# Patient Record
Sex: Male | Born: 1985 | Race: White | Hispanic: No | Marital: Married | Smoking: Never smoker
Health system: Southern US, Community
[De-identification: ages and names within clinical notes are randomized; demographics above are authoritative.]

---

## 2004-08-04 DIAGNOSIS — N200 Calculus of kidney: Secondary | ICD-10-CM

## 2004-08-04 HISTORY — DX: Calculus of kidney: N20.0

## 2005-07-05 ENCOUNTER — Emergency Department (HOSPITAL_COMMUNITY): Admission: EM | Admit: 2005-07-05 | Discharge: 2005-07-05 | Payer: Self-pay | Admitting: Emergency Medicine

## 2006-04-03 IMAGING — CT CT PELVIS W/O CM
2 of 4 series · 17 of 46 positions shown, 19 images · IV contrast (agent unspecified)
Comparison: none

CLINICAL DATA: Right-sided renal colic with flank pain.  Hematuria.  
 ABDOMEN CT WITHOUT CONTRAST:
TECHNIQUE: Multidetector CT imaging of the abdomen was performed following the standard protocol without IV contrast.
TECHNIQUE: Multidetector CT imaging of the pelvis was performed following the standard protocol without IV contrast.

[Series 2: stone proto 5.0 b31f · axial · 0.69mm/px · z∈[-456,-46]mm · 14 of 90 slices shown, 16 images]
[im 4/90  soft-tissue]
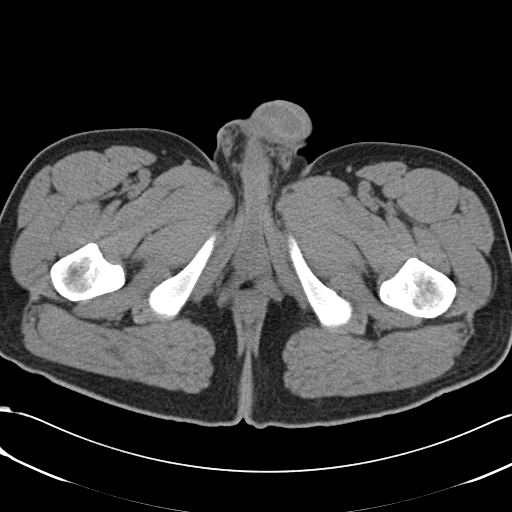
[im 4/90  bone]
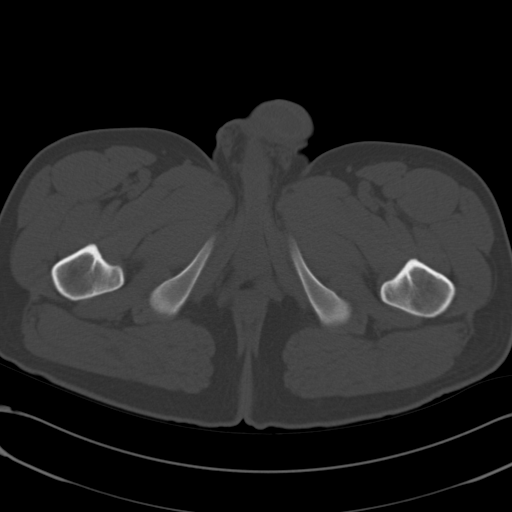
[im 11/90  soft-tissue]
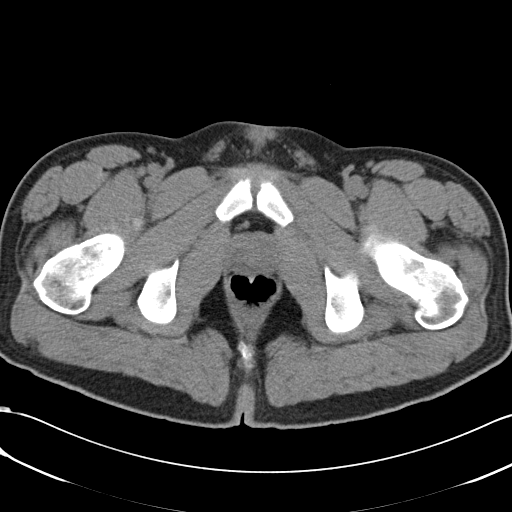
[im 18/90  soft-tissue]
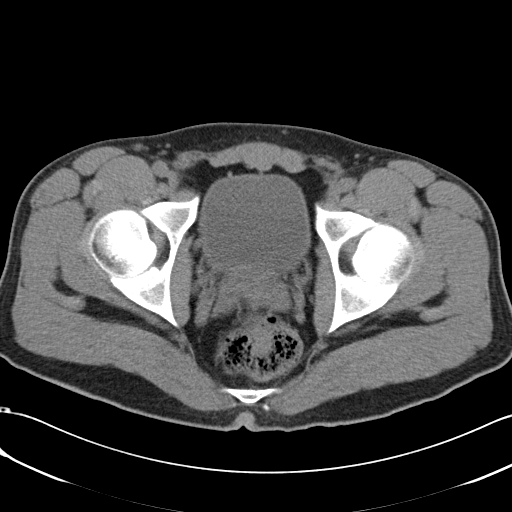
[im 25/90  soft-tissue]
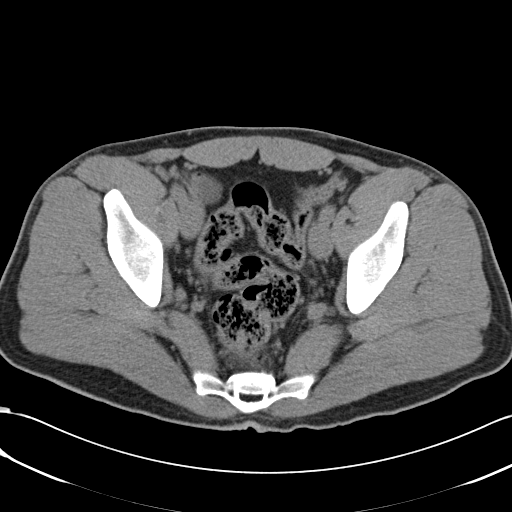
[im 29/90  soft-tissue]
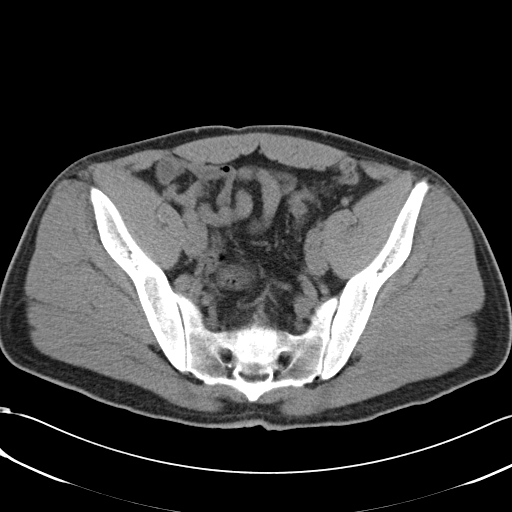
[im 36/90  soft-tissue]
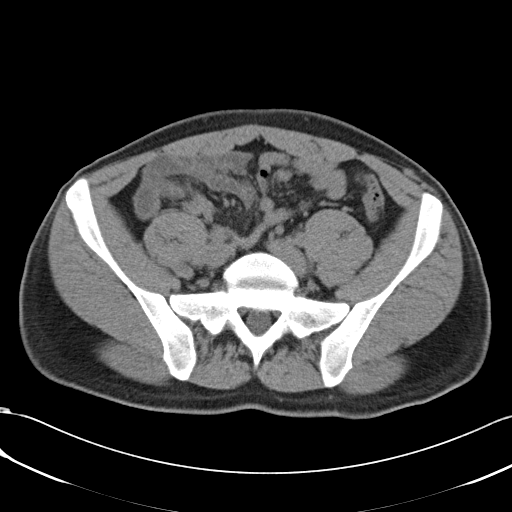
[im 43/90  soft-tissue]
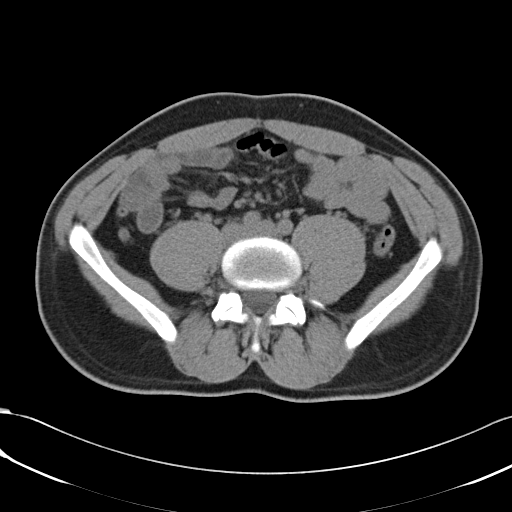
[im 47/90  soft-tissue]
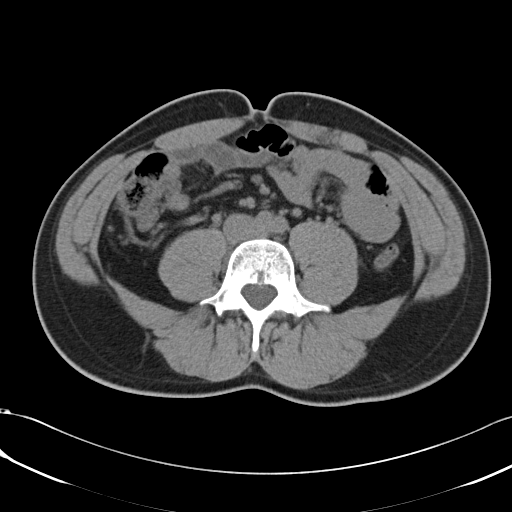
[im 54/90  soft-tissue]
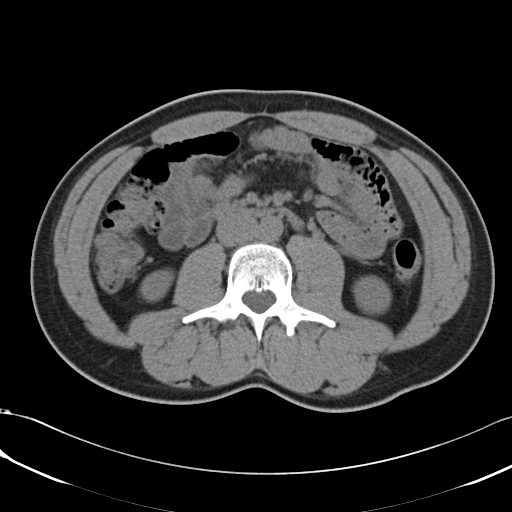
[im 54/90  bone]
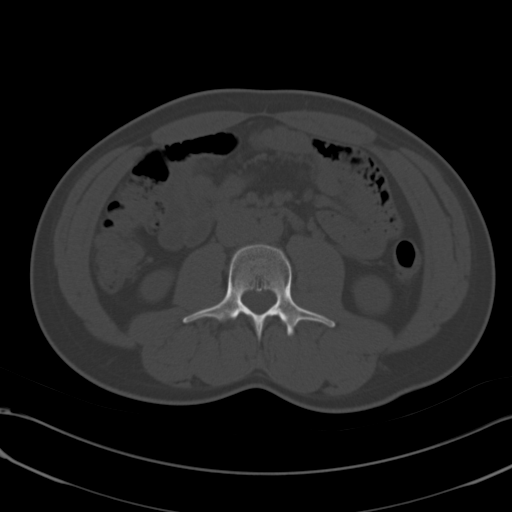
[im 61/90  soft-tissue]
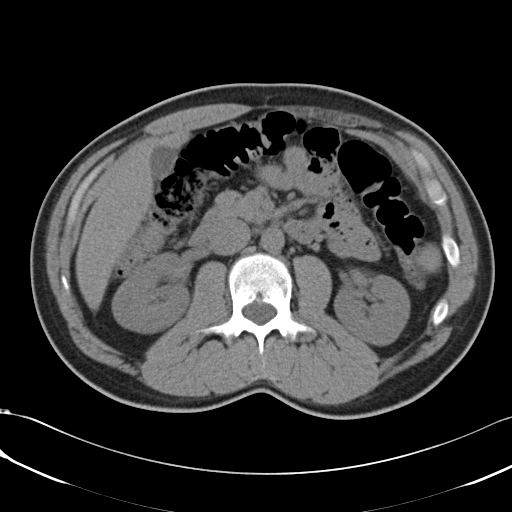
[im 68/90  soft-tissue]
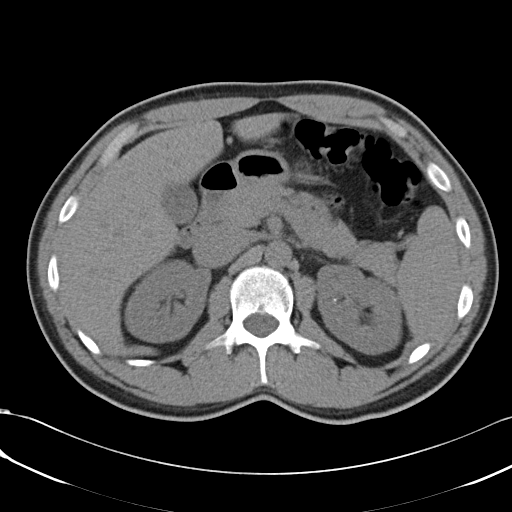
[im 72/90  soft-tissue]
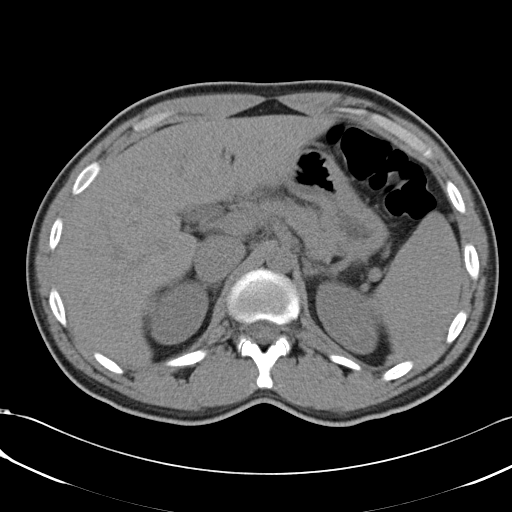
[im 79/90  soft-tissue]
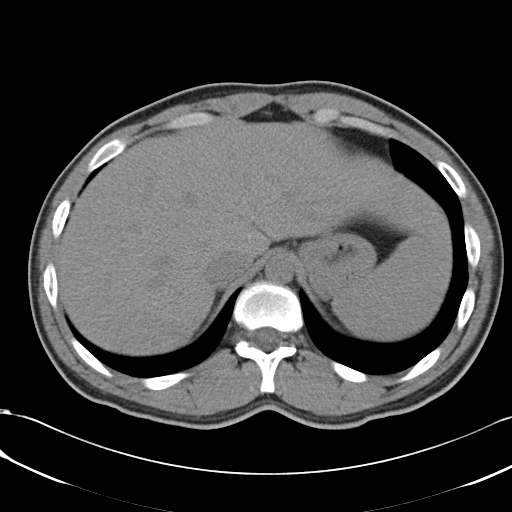
[im 86/90  soft-tissue]
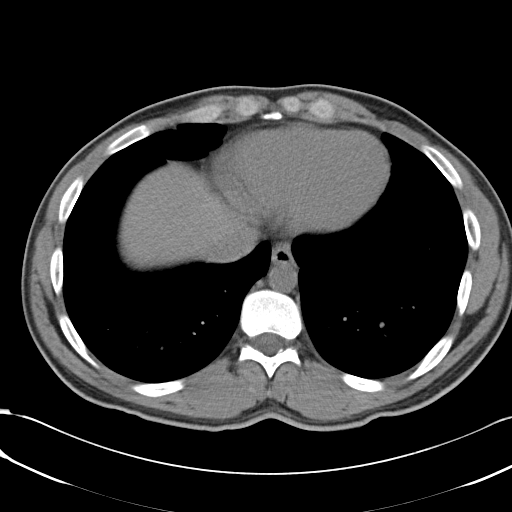

[Series 4: stone proto 5.0 spo · coronal · 0.88mm/px · 3 of 47 slices shown]
[im 16/47  soft-tissue]
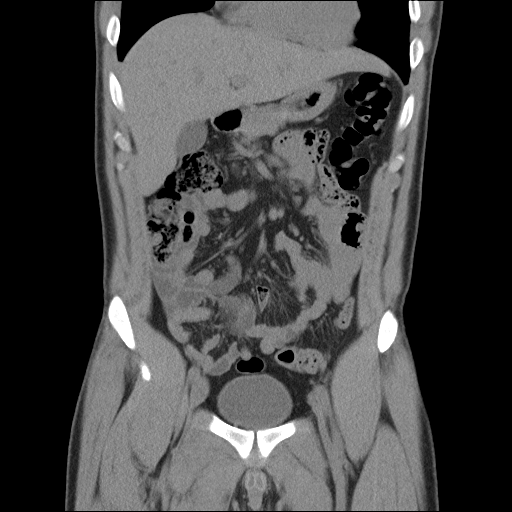
[im 21/47  soft-tissue]
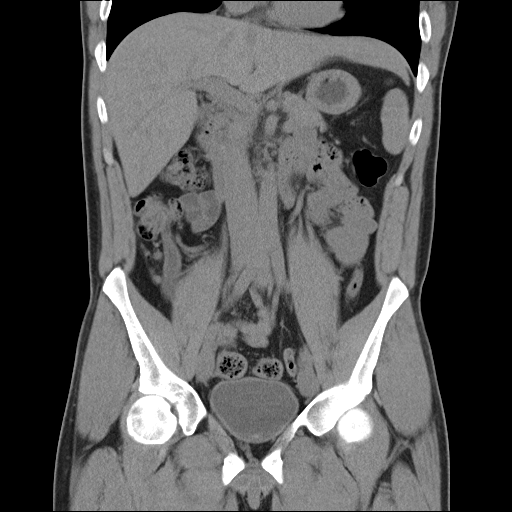
[im 26/47  soft-tissue]
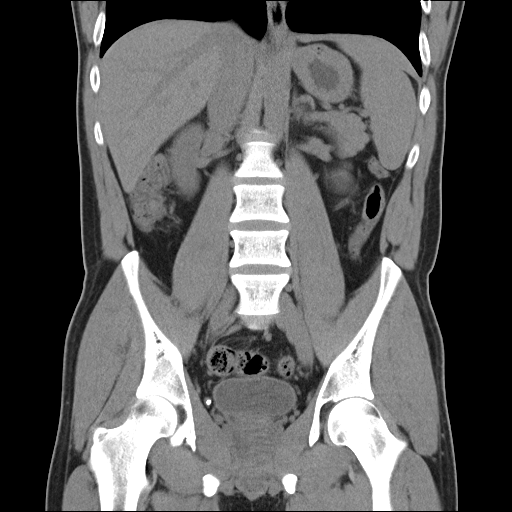

[17 of 46 positions shown; findings below may reference images not displayed]

FINDINGS: A tiny 1 mm intrarenal calculus is seen in the upper pole of the left kidney.  There is no evidence of hydronephrosis involving either kidney.  There is no evidence of perinephric fluid or inflammatory changes.  Both kidneys are normal in contour.
 The other abdominal parenchymal organs are unremarkable in appearance on this noncontrast study.  There is no evidence of adenopathy.  No inflammatory process or abnormal fluid collections are identified.  The unopacified bowel loops are unremarkable in appearance.
IMPRESSION: Tiny 1 mm calculus in the upper pole of the left kidney.  No evidence of hydronephrosis.
 PELVIS CT WITHOUT CONTRAST:
FINDINGS: A calculus is seen within the urinary bladder along the posterior wall measuring 4 mm.  No ureteral calculi are seen and there is no evidence of ureteral dilatation.  
 There is no evidence of a pelvic mass or inflammatory process.  No abnormal fluid collections are seen.  Unopacified bowel loops are unremarkable in appearance.
IMPRESSION: 4 mm calculus seen within the urinary bladder.  No evidence of ureteral calculi or dilatation.

## 2012-01-22 ENCOUNTER — Ambulatory Visit (INDEPENDENT_AMBULATORY_CARE_PROVIDER_SITE_OTHER): Payer: BC Managed Care – PPO | Admitting: Internal Medicine

## 2012-01-22 VITALS — BP 119/73 | HR 57 | Temp 98.2°F | Resp 16 | Ht 71.0 in | Wt 172.0 lb

## 2012-01-22 DIAGNOSIS — Z7189 Other specified counseling: Secondary | ICD-10-CM

## 2012-01-22 NOTE — Progress Notes (Signed)
  Subjective:    Patient ID: Glen Small, male    DOB: 1986/06/06, 26 y.o.   MRN: [id number]  HPI Healthy, 26 y/o needs Tdap   Review of Systems neg   Objective:   Physical Exam Normal       Assessment & Plan:  Tdap update Review CDC travel for details

## 2012-01-23 ENCOUNTER — Other Ambulatory Visit: Payer: Self-pay | Admitting: Physician Assistant

## 2012-01-23 DIAGNOSIS — Z789 Other specified health status: Secondary | ICD-10-CM

## 2012-01-23 MED ORDER — DOXYCYCLINE HYCLATE 100 MG PO CAPS: 100.0000 mg | ORAL_CAPSULE | Freq: Every day | ORAL | Status: AC

## 2012-01-23 NOTE — Progress Notes (Signed)
Patient was just here for immunization review for up coming travel to [place] for one week. Going on a mission trip. He did not receive malaria PPX. I am seeing his wife today for the same, and feel it is appropriate to cover for malaria. I have called in enough Doxycycline to treat him for this PPX.

## 2019-05-25 ENCOUNTER — Other Ambulatory Visit: Payer: Self-pay

## 2019-05-25 DIAGNOSIS — Z20822 Contact with and (suspected) exposure to covid-19: Secondary | ICD-10-CM

## 2019-05-26 LAB — NOVEL CORONAVIRUS, NAA: SARS-CoV-2, NAA: DETECTED — AB

## 2019-05-30 ENCOUNTER — Telehealth: Payer: Self-pay | Admitting: Critical Care Medicine

## 2019-05-30 NOTE — Telephone Encounter (Signed)
I was able to connect with this patient who tested positive from a October 22 testing event for Covid.  He has had symptoms since 21 October consisting of cough body aches and loss of taste and smell.  All of the symptoms are rapidly improving.  He understands he needs to stay in isolation till November 1.  He feels his contact was his brother who was positive for Covid.  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  He understands health apartment may be in touch with him.

## 2021-08-22 DIAGNOSIS — H109 Unspecified conjunctivitis: Secondary | ICD-10-CM | POA: Diagnosis not present

## 2021-09-05 ENCOUNTER — Encounter: Payer: Self-pay | Admitting: Registered Nurse

## 2021-09-05 ENCOUNTER — Ambulatory Visit: Payer: BC Managed Care – PPO | Admitting: Registered Nurse

## 2021-09-05 VITALS — BP 131/82 | HR 71 | Temp 98.3°F | Resp 17 | Ht 71.0 in | Wt 193.8 lb

## 2021-09-05 DIAGNOSIS — H1033 Unspecified acute conjunctivitis, bilateral: Secondary | ICD-10-CM | POA: Diagnosis not present

## 2021-09-05 DIAGNOSIS — Z7689 Persons encountering health services in other specified circumstances: Secondary | ICD-10-CM

## 2021-09-05 NOTE — Patient Instructions (Addendum)
Mr. Teixeira -   Randie Heinz to meet you.  I recommend annual physical and lab.  See below for some general info on health maintenance.  Thank you,  Rich     If you have lab work done today you will be contacted with your lab results within the next 2 weeks.  If you have not heard from [place] then please contact us. The fastest way to get your results is to register for My Chart.   IF you received an x-ray today, you will receive an invoice from [facility]. Please contact [facility] at [phone] with questions or concerns regarding your invoice.   IF you received labwork today, you will receive an invoice from [facility]. Please contact [facility] at [phone] with questions or concerns regarding your invoice.   Our billing staff will not be able to assist you with questions regarding bills from these companies.  You will be contacted with the lab results as soon as they are available. The fastest way to get your results is to activate your My Chart account. Instructions are located on the last page of this paperwork. If you have not heard from [place] regarding the results in 2 weeks, please contact this office.

## 2021-09-05 NOTE — Progress Notes (Signed)
New Patient Office Visit  Subjective:  Patient ID: Glen Small, male    DOB: [birth year]  Age: 36 y.o. MRN: [id number]  CC:  Chief Complaint  Patient presents with   New Patient (Initial Visit)    Patient states he is here to establish care    HPI Glen Small presents for visit to est care  No acute concerns  Recent conjunctivitis. Seen at urgent care. Tx with abx No ongoing complaints. It has resolved.  Works for [employer].   History reviewed. No pertinent past medical history.  History reviewed. No pertinent surgical history.  History reviewed. No pertinent family history.  Social History   Socioeconomic History   Marital status: Married    Spouse name: Not on file   Number of children: 3   Years of education: Not on file   Highest education level: Not on file  Occupational History   Occupation: Financing    Employer: [employer]  Tobacco Use   Smoking status: Never   Smokeless tobacco: Not on file  Vaping Use   Vaping Use: Never used  Substance and Sexual Activity   Alcohol use: Yes    Alcohol/week: 18.0 standard drinks    Types: 18 Standard drinks or equivalent per week   Drug use: Not Currently   Sexual activity: Yes    Partners: Female    Birth control/protection: Condom  Other Topics Concern   Not on file  Social History Narrative   Not on file   Social Determinants of Health   Financial Resource Strain: Not on file  Food Insecurity: Not on file  Transportation Needs: Not on file  Physical Activity: Not on file  Stress: Not on file  Social Connections: Not on file  Intimate Partner Violence: Not on file    ROS Review of Systems  Constitutional: Negative.   HENT: Negative.    Eyes: Negative.   Respiratory: Negative.    Cardiovascular: Negative.   Gastrointestinal: Negative.   Genitourinary: Negative.   Musculoskeletal: Negative.   Skin: Negative.   Neurological: Negative.   Psychiatric/Behavioral: Negative.    All other  systems reviewed and are negative.  Objective:   Today's Vitals: BP 131/82    Pulse 71    Temp 98.3 F (36.8 C) (Temporal)    Resp 17    Ht 5\' 11"  (1.803 m)    Wt 193 lb 12.8 oz (87.9 kg)    SpO2 97%    BMI 27.03 kg/m   Physical Exam Vitals and nursing note reviewed.  Constitutional:      Appearance: Normal appearance.  Cardiovascular:     Rate and Rhythm: Normal rate and regular rhythm.     Pulses: Normal pulses.     Heart sounds: Normal heart sounds. No murmur heard.   No friction rub. No gallop.  Pulmonary:     Effort: Pulmonary effort is normal. No respiratory distress.     Breath sounds: Normal breath sounds. No stridor. No wheezing, rhonchi or rales.  Neurological:     General: No focal deficit present.     Mental Status: He is alert. Mental status is at baseline.  Psychiatric:        Mood and Affect: Mood normal.        Behavior: Behavior normal.        Thought Content: Thought content normal.        Judgment: Judgment normal.    Assessment & Plan:   Problem List Items Addressed This Visit  None Visit Diagnoses     Acute bacterial conjunctivitis of both eyes    -  Primary   Encounter to establish care           No outpatient encounter medications on file as of 09/05/2021.   No facility-administered encounter medications on file as of 09/05/2021.    Follow-up: Return in about 1 year (around 09/05/2022) for CPE and labs.   PLAN Recommend CPE and labs. Pt declines. Return annually for CPE and labs, sooner with concerns Patient encouraged to call clinic with any questions, comments, or concerns.  Janeece Agee, NP

## 2022-03-24 ENCOUNTER — Encounter: Payer: BC Managed Care – PPO | Admitting: Family Medicine

## 2022-04-21 ENCOUNTER — Ambulatory Visit (INDEPENDENT_AMBULATORY_CARE_PROVIDER_SITE_OTHER): Payer: BC Managed Care – PPO | Admitting: Family Medicine

## 2022-04-21 ENCOUNTER — Encounter: Payer: Self-pay | Admitting: Family Medicine

## 2022-04-21 VITALS — BP 110/71 | HR 62 | Temp 98.6°F | Ht 71.75 in | Wt 195.8 lb

## 2022-04-21 DIAGNOSIS — Z0000 Encounter for general adult medical examination without abnormal findings: Secondary | ICD-10-CM | POA: Diagnosis not present

## 2022-04-21 LAB — LIPID PANEL
Cholesterol: 188 mg/dL (ref 0–200)
HDL: 43.6 mg/dL (ref >39.00)
LDL Cholesterol: 119 mg/dL — ABNORMAL HIGH (ref 0–99)
NonHDL: 144.87
Total CHOL/HDL Ratio: 4
Triglycerides: 131 mg/dL (ref 0.0–149.0)
VLDL: 26.2 mg/dL (ref 0.0–40.0)

## 2022-04-21 LAB — COMPREHENSIVE METABOLIC PANEL
ALT: 17 U/L (ref 0–53)
AST: 17 U/L (ref 0–37)
Albumin: 4.3 g/dL (ref 3.5–5.2)
Alkaline Phosphatase: 60 U/L (ref 39–117)
BUN: 15 mg/dL (ref 6–23)
CO2: 28 mEq/L (ref 19–32)
Calcium: 9.4 mg/dL (ref 8.4–10.5)
Chloride: 105 mEq/L (ref 96–112)
Creatinine, Ser: 1.11 mg/dL (ref 0.40–1.50)
GFR: 84.37 mL/min (ref >60.00)
Glucose, Bld: 92 mg/dL (ref 70–99)
Potassium: 4.3 mEq/L (ref 3.5–5.1)
Sodium: 141 mEq/L (ref 135–145)
Total Bilirubin: 0.5 mg/dL (ref 0.2–1.2)
Total Protein: 7.1 g/dL (ref 6.0–8.3)

## 2022-04-21 LAB — CBC WITH DIFFERENTIAL/PLATELET
Basophils Absolute: 0 10*3/uL (ref 0.0–0.1)
Basophils Relative: 0.8 % (ref 0.0–3.0)
Eosinophils Absolute: 0.6 10*3/uL (ref 0.0–0.7)
Eosinophils Relative: 11.4 % — ABNORMAL HIGH (ref 0.0–5.0)
HCT: 42.7 % (ref 39.0–52.0)
Hemoglobin: 14.5 g/dL (ref 13.0–17.0)
Lymphocytes Relative: 29 % (ref 12.0–46.0)
Lymphs Abs: 1.4 10*3/uL (ref 0.7–4.0)
MCHC: 33.9 g/dL (ref 30.0–36.0)
MCV: 89 fl (ref 78.0–100.0)
Monocytes Absolute: 0.5 10*3/uL (ref 0.1–1.0)
Monocytes Relative: 9.3 % (ref 3.0–12.0)
Neutro Abs: 2.4 10*3/uL (ref 1.4–7.7)
Neutrophils Relative %: 49.5 % (ref 43.0–77.0)
Platelets: 258 10*3/uL (ref 150.0–400.0)
RBC: 4.8 Mil/uL (ref 4.22–5.81)
RDW: 13 % (ref 11.5–15.5)
WBC: 4.9 10*3/uL (ref 4.0–10.5)

## 2022-04-21 LAB — TSH: TSH: 1.69 u[IU]/mL (ref 0.35–5.50)

## 2022-04-21 NOTE — Patient Instructions (Signed)
Health Maintenance, Male Adopting a healthy lifestyle and getting preventive care are important in promoting health and wellness. Ask your health care provider about: The right schedule for you to have regular tests and exams. Things you can do on your own to prevent diseases and keep yourself healthy. What should I know about diet, weight, and exercise? Eat a healthy diet  Eat a diet that includes plenty of vegetables, fruits, low-fat dairy products, and lean protein. Do not eat a lot of foods that are high in solid fats, added sugars, or sodium. Maintain a healthy weight Body mass index (BMI) is a measurement that can be used to identify possible weight problems. It estimates body fat based on height and weight. Your health care provider can help determine your BMI and help you achieve or maintain a healthy weight. Get regular exercise Get regular exercise. This is one of the most important things you can do for your health. Most adults should: Exercise for at least 150 minutes each week. The exercise should increase your heart rate and make you sweat (moderate-intensity exercise). Do strengthening exercises at least twice a week. This is in addition to the moderate-intensity exercise. Spend less time sitting. Even light physical activity can be beneficial. Watch cholesterol and blood lipids Have your blood tested for lipids and cholesterol at 38 years of age, then have this test every 5 years. You may need to have your cholesterol levels checked more often if: Your lipid or cholesterol levels are high. You are older than 38 years of age. You are at high risk for heart disease. What should I know about cancer screening? Many types of cancers can be detected early and may often be prevented. Depending on your health history and family history, you may need to have cancer screening at various ages. This may include screening for: Colorectal cancer. Prostate cancer. Skin cancer. Lung  cancer. What should I know about heart disease, diabetes, and high blood pressure? Blood pressure and heart disease High blood pressure causes heart disease and increases the risk of stroke. This is more likely to develop in people who have high blood pressure readings or are overweight. Talk with your health care provider about your target blood pressure readings. Have your blood pressure checked: Every 3-5 years if you are 18-39 years of age. Every year if you are 40 years old or older. If you are between the ages of 65 and 75 and are a current or former smoker, ask your health care provider if you should have a one-time screening for abdominal aortic aneurysm (AAA). Diabetes Have regular diabetes screenings. This checks your fasting blood sugar level. Have the screening done: Once every three years after age 45 if you are at a normal weight and have a low risk for diabetes. More often and at a younger age if you are overweight or have a high risk for diabetes. What should I know about preventing infection? Hepatitis B If you have a higher risk for hepatitis B, you should be screened for this virus. Talk with your health care provider to find out if you are at risk for hepatitis B infection. Hepatitis C Blood testing is recommended for: Everyone born from 1945 through 1965. Anyone with known risk factors for hepatitis C. Sexually transmitted infections (STIs) You should be screened each year for STIs, including gonorrhea and chlamydia, if: You are sexually active and are younger than 38 years of age. You are older than 38 years of age and your   health care provider tells you that you are at risk for this type of infection. Your sexual activity has changed since you were last screened, and you are at increased risk for chlamydia or gonorrhea. Ask your health care provider if you are at risk. Ask your health care provider about whether you are at high risk for HIV. Your health care provider  may recommend a prescription medicine to help prevent HIV infection. If you choose to take medicine to prevent HIV, you should first get tested for HIV. You should then be tested every 3 months for as long as you are taking the medicine. Follow these instructions at home: Alcohol use Do not drink alcohol if your health care provider tells you not to drink. If you drink alcohol: Limit how much you have to 0-2 drinks a day. Know how much alcohol is in your drink. In the [place] one drink equals one 12 oz bottle of beer (355 mL), one 5 oz glass of wine (148 mL), or one 1 oz glass of hard liquor (44 mL). Lifestyle Do not use any products that contain nicotine or tobacco. These products include cigarettes, chewing tobacco, and vaping devices, such as e-cigarettes. If you need help quitting, ask your health care provider. Do not use street drugs. Do not share needles. Ask your health care provider for help if you need support or information about quitting drugs. General instructions Schedule regular health, dental, and eye exams. Stay current with your vaccines. Tell your health care provider if: You often feel depressed. You have ever been abused or do not feel safe at home. Summary Adopting a healthy lifestyle and getting preventive care are important in promoting health and wellness. Follow your health care provider's instructions about healthy diet, exercising, and getting tested or screened for diseases. Follow your health care provider's instructions on monitoring your cholesterol and blood pressure. This information is not intended to replace advice given to you by your health care provider. Make sure you discuss any questions you have with your health care provider. Document Revised: 12/10/2020 Document Reviewed: 12/10/2020 Elsevier Patient Education  2023 Elsevier Inc.  

## 2022-04-21 NOTE — Progress Notes (Signed)
Office Note 04/21/2022  CC:  Chief Complaint  Patient presents with   Establish Care    Transfer of Care    HPI:  Glen Small is a 36 y.o. male who is here to establish/transfer care and for CPE. Patient's most recent primary MD: Janeece Agee, NP with [facility]. Old records in [facility] EMR were reviewed prior to or during today's visit.  No concerns.  Past Medical History:  Diagnosis Date   Kidney stone 2006    History reviewed. No pertinent surgical history.  History reviewed. No pertinent family history.  Social History   Socioeconomic History   Marital status: Married    Spouse name: Not on file   Number of children: 3   Years of education: Not on file   Highest education level: Not on file  Occupational History   Occupation: Financing    Employer: [employer]  Tobacco Use   Smoking status: Never   Smokeless tobacco: Not on file  Vaping Use   Vaping Use: Never used  Substance and Sexual Activity   Alcohol use: Yes    Alcohol/week: 18.0 standard drinks of alcohol    Types: 18 Standard drinks or equivalent per week   Drug use: Not Currently   Sexual activity: Yes    Partners: Female    Birth control/protection: Condom  Other Topics Concern   Not on file  Social History Narrative   Married, 3 children.  Lives in [place].   Originally from [place], high school    in [place].  C   College at [employer].   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   Nonsmoker   Social Determinants of Health   Financial Resource Strain: Not on file  Food Insecurity: Not on file  Transportation Needs: Not on file  Physical Activity: Not on file  Stress: Not on file  Social Connections: Not on file  Intimate Partner Violence: Not on file    Outpatient Encounter Medications as of 04/21/2022  Medication Sig   Multiple Vitamins-Minerals (MULTIVITAMIN MEN PO) Take by mouth.   No facility-administered encounter medications on file as of 04/21/2022.    Allergies   Allergen Reactions   Tylenol [Acetaminophen] Itching    Itchy throat.    Review of Systems  Constitutional:  Negative for appetite change, chills, fatigue and fever.  HENT:  Negative for congestion, dental problem, ear pain and sore throat.   Eyes:  Negative for discharge, redness and visual disturbance.  Respiratory:  Negative for cough, chest tightness, shortness of breath and wheezing.   Cardiovascular:  Negative for chest pain, palpitations and leg swelling.  Gastrointestinal:  Negative for abdominal pain, blood in stool, diarrhea, nausea and vomiting.  Genitourinary:  Negative for difficulty urinating, dysuria, flank pain, frequency, hematuria and urgency.  Musculoskeletal:  Negative for arthralgias, back pain, joint swelling, myalgias and neck stiffness.  Skin:  Negative for pallor and rash.  Neurological:  Negative for dizziness, speech difficulty, weakness and headaches.  Hematological:  Negative for adenopathy. Does not bruise/bleed easily.  Psychiatric/Behavioral:  Negative for confusion and sleep disturbance. The patient is not nervous/anxious.     PE; Blood pressure 110/71, pulse 62, temperature 98.6 F (37 C), height 5' 11.75" (1.822 m), weight 195 lb 12.8 oz (88.8 kg), SpO2 97 %.Body mass index is 26.74 kg/m.  Physical Exam  Gen: Alert, well appearing.  Patient is oriented to person, place, time, and situation. AFFECT: pleasant, lucid thought and speech. ENT: Ears: EACs clear, normal epithelium.  TMs  with good light reflex and landmarks bilaterally.  Eyes: no injection, icteris, swelling, or exudate.  EOMI, PERRLA. Nose: no drainage or turbinate edema/swelling.  No injection or focal lesion.  Mouth: lips without lesion/swelling.  Oral mucosa pink and moist.  Dentition intact and without obvious caries or gingival swelling.  Oropharynx without erythema, exudate, or swelling.  Neck: supple/nontender.  No LAD, mass, or TM.  Carotid pulses 2+ bilaterally, without  bruits. CV: RRR, no m/r/g.   LUNGS: CTA bilat, nonlabored resps, good aeration in all lung fields. ABD: soft, NT, ND, BS normal.  No hepatospenomegaly or mass.  No bruits. EXT: no clubbing, cyanosis, or edema.  Musculoskeletal: no joint swelling, erythema, warmth, or tenderness.  ROM of all joints intact. Skin - no sores or suspicious lesions or rashes or color changes  Pertinent labs:  none   ASSESSMENT AND PLAN:   No problem-specific Assessment & Plan notes found for this encounter.  Health maintenance exam: Reviewed age and gender appropriate health maintenance issues (prudent diet, regular exercise, health risks of tobacco and excessive alcohol, use of seatbelts, fire alarms in home, use of sunscreen).  Also reviewed age and gender appropriate health screening as well as vaccine recommendations. Vaccines: he declined flu vaccine. Labs: Fasting health panel.  An After Visit Summary was printed and given to the patient.  Return for annual CPE (fasting).  Signed:  Crissie Sickles, MD           04/21/2022

## 2022-08-18 ENCOUNTER — Ambulatory Visit: Payer: BC Managed Care – PPO | Admitting: Family Medicine

## 2022-08-18 ENCOUNTER — Encounter: Payer: Self-pay | Admitting: Family Medicine

## 2022-08-18 VITALS — BP 121/87 | HR 74 | Temp 98.3°F | Ht 71.75 in | Wt 193.6 lb

## 2022-08-18 DIAGNOSIS — J019 Acute sinusitis, unspecified: Secondary | ICD-10-CM | POA: Diagnosis not present

## 2022-08-18 MED ORDER — AMOXICILLIN 875 MG PO TABS: 875.0000 mg | ORAL_TABLET | Freq: Two times a day (BID) | ORAL | 0 refills | Status: AC

## 2022-08-18 NOTE — Progress Notes (Signed)
OFFICE VISIT  08/18/2022  CC:  Chief Complaint  Patient presents with   Nasal congestion    Pt c/o body aches, chills, low grade fever (99.8), slight headache. Denies n/v/d, sore throat. Has been using Sudafed, Dayquil, Nightquil, Tylenol and Motrin    Patient is a 37 y.o. male who presents for URI sx's.  HPI: About 9-days ago he developed a sore throat, nasal congestion, postnasal drip, fatigue, and some bodyaches.  Subjective fever. No chest pain, wheezing, or shortness of breath.  Not much coughing. Symptoms improved somewhat, particularly the sore throat.  Then a couple of days ago he began to get severe nasal congestion, sinus pressure, and felt low-grade fever again.  Checked his temperature this morning it was 99.8.  No nausea, vomiting, diarrhea, or rash. Trying many over-the-counter cold medications.   Past Medical History:  Diagnosis Date   Kidney stone 2006    History reviewed. No pertinent surgical history.  Outpatient Medications Prior to Visit  Medication Sig Dispense Refill   Multiple Vitamins-Minerals (MULTIVITAMIN MEN PO) Take by mouth.     No facility-administered medications prior to visit.    Allergies  Allergen Reactions   Tylenol [Acetaminophen] Itching    Itchy throat.    Review of Systems  As per HPI  PE:    08/18/2022    3:10 PM 04/21/2022   10:05 AM 09/05/2021    2:14 PM  Vitals with BMI  Height 5' 11.75" 5' 11.75" 5\' 11"   Weight 193 lbs 10 oz 195 lbs 13 oz 193 lbs 13 oz  BMI 26.45 56.38 75.64  Systolic 332 951 884  Diastolic 87 71 82  Pulse 74 62 71     Physical Exam  VS: noted--normal. Gen: alert, NAD, NONTOXIC APPEARING. HEENT: eyes without injection, drainage, or swelling.  Ears: EACs clear, TMs with normal light reflex and landmarks.  Nose: Clear rhinorrhea, with some dried, crusty exudate adherent to mildly injected mucosa.  R nostril purulent d/c noted.  No paranasal sinus TTP.  No facial swelling.  Throat and mouth without  focal lesion.  No pharyngial swelling, erythema, or exudate.   Neck: supple, no LAD.   LUNGS: CTA bilat, nonlabored resps.   CV: RRR, no m/r/g. EXT: no c/c/e SKIN: no rash   LABS:  none  IMPRESSION AND PLAN:  Prolonged URI, "double sickening" described by pt. Will treat as bacterial sinusitis with amoxicillin 875 mg twice daily x 10 days. Use otc generic saline nasal spray 2-3 times per day to irrigate/moisturize your nasal passages.  Rapid covid NEG here today.  An After Visit Summary was printed and given to the patient.  FOLLOW UP: No follow-ups on file.  Signed:  Crissie Sickles, MD           08/18/2022

## 2022-08-18 NOTE — Patient Instructions (Signed)
  Use otc generic saline nasal spray 2-3 times per day to irrigate/moisturize your nasal passages.   

## 2023-03-12 DIAGNOSIS — F4322 Adjustment disorder with anxiety: Secondary | ICD-10-CM | POA: Diagnosis not present

## 2023-03-19 ENCOUNTER — Encounter (INDEPENDENT_AMBULATORY_CARE_PROVIDER_SITE_OTHER): Payer: Self-pay

## 2023-03-23 DIAGNOSIS — F4322 Adjustment disorder with anxiety: Secondary | ICD-10-CM | POA: Diagnosis not present

## 2023-04-24 ENCOUNTER — Encounter: Payer: BC Managed Care – PPO | Admitting: Family Medicine

## 2023-04-24 NOTE — Progress Notes (Deleted)
Office Note 04/24/2023  CC: No chief complaint on file.   HPI:  Patient is a 37 y.o. male who is here for annual health maintenance exam.  Past Medical History:  Diagnosis Date   Kidney stone 2006    No past surgical history on file.  No family history on file.  Social History   Socioeconomic History   Marital status: Married    Spouse name: Not on file   Number of children: 3   Years of education: Not on file   Highest education level: Not on file  Occupational History   Occupation: Financing    Employer: [employer]  Tobacco Use   Smoking status: Never   Smokeless tobacco: Not on file  Vaping Use   Vaping status: Never Used  Substance and Sexual Activity   Alcohol use: Yes    Alcohol/week: 18.0 standard drinks of alcohol    Types: 18 Standard drinks or equivalent per week   Drug use: Not Currently   Sexual activity: Yes    Partners: Female    Birth control/protection: Condom  Other Topics Concern   Not on file  Social History Narrative   Married, 3 children.  Lives in [place].   Originally from [place], high school    in [place].  C   College at [employer].   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   Nonsmoker   Social Determinants of Corporate investment banker Strain: Not on file  Food Insecurity: Not on file  Transportation Needs: Not on file  Physical Activity: Not on file  Stress: Not on file  Social Connections: Not on file  Intimate Partner Violence: Not on file    Outpatient Medications Prior to Visit  Medication Sig Dispense Refill   Multiple Vitamins-Minerals (MULTIVITAMIN MEN PO) Take by mouth.     No facility-administered medications prior to visit.    Allergies  Allergen Reactions   Tylenol [Acetaminophen] Itching    Itchy throat.    Review of Systems *** PE;    08/18/2022    3:10 PM 04/21/2022   10:05 AM 09/05/2021    2:14 PM  Vitals with BMI  Height 5' 11.75" 5' 11.75" 5\' 11"   Weight 193 lbs 10 oz 195 lbs 13 oz 193 lbs 13 oz  BMI  26.45 26.75 27.04  Systolic 121 110 098  Diastolic 87 71 82  Pulse 74 62 71     *** Pertinent labs:  *** ASSESSMENT AND PLAN:   No problem-specific Assessment & Plan notes found for this encounter.  Health maintenance exam: Reviewed age and gender appropriate health maintenance issues (prudent diet, regular exercise, health risks of tobacco and excessive alcohol, use of seatbelts, fire alarms in home, use of sunscreen).  Also reviewed age and gender appropriate health screening as well as vaccine recommendations. Vaccines: he declined flu vaccine.*** Labs: Fasting health panel.  An After Visit Summary was printed and given to the patient.  FOLLOW UP:  No follow-ups on file.  Signed:  Santiago Bumpers, MD           04/24/2023

## 2023-05-20 ENCOUNTER — Encounter: Payer: Self-pay | Admitting: Family Medicine

## 2024-05-20 ENCOUNTER — Ambulatory Visit (INDEPENDENT_AMBULATORY_CARE_PROVIDER_SITE_OTHER): Admitting: Family Medicine

## 2024-05-20 ENCOUNTER — Encounter: Payer: Self-pay | Admitting: Family Medicine

## 2024-05-20 VITALS — BP 104/67 | HR 69 | Temp 98.5°F | Ht 71.25 in | Wt 197.0 lb

## 2024-05-20 DIAGNOSIS — Z0000 Encounter for general adult medical examination without abnormal findings: Secondary | ICD-10-CM | POA: Diagnosis not present

## 2024-05-20 NOTE — Patient Instructions (Signed)
 Health Maintenance, Male  Adopting a healthy lifestyle and getting preventive care are important in promoting health and wellness. Ask your health care provider about:  The right schedule for you to have regular tests and exams.  Things you can do on your own to prevent diseases and keep yourself healthy.  What should I know about diet, weight, and exercise?  Eat a healthy diet    Eat a diet that includes plenty of vegetables, fruits, low-fat dairy products, and lean protein.  Do not eat a lot of foods that are high in solid fats, added sugars, or sodium.  Maintain a healthy weight  Body mass index (BMI) is a measurement that can be used to identify possible weight problems. It estimates body fat based on height and weight. Your health care provider can help determine your BMI and help you achieve or maintain a healthy weight.  Get regular exercise  Get regular exercise. This is one of the most important things you can do for your health. Most adults should:  Exercise for at least 150 minutes each week. The exercise should increase your heart rate and make you sweat (moderate-intensity exercise).  Do strengthening exercises at least twice a week. This is in addition to the moderate-intensity exercise.  Spend less time sitting. Even light physical activity can be beneficial.  Watch cholesterol and blood lipids  Have your blood tested for lipids and cholesterol at 40 years of age, then have this test every 5 years.  You may need to have your cholesterol levels checked more often if:  Your lipid or cholesterol levels are high.  You are older than 40 years of age.  You are at high risk for heart disease.  What should I know about cancer screening?  Many types of cancers can be detected early and may often be prevented. Depending on your health history and family history, you may need to have cancer screening at various ages. This may include screening for:  Colorectal cancer.  Prostate cancer.  Skin cancer.  Lung  cancer.  What should I know about heart disease, diabetes, and high blood pressure?  Blood pressure and heart disease  High blood pressure causes heart disease and increases the risk of stroke. This is more likely to develop in people who have high blood pressure readings or are overweight.  Talk with your health care provider about your target blood pressure readings.  Have your blood pressure checked:  Every 3-5 years if you are 40-40 years of age.  Every year if you are 3 years old or older.  If you are between the ages of 60 and 72 and are a current or former smoker, ask your health care provider if you should have a one-time screening for abdominal aortic aneurysm (AAA).  Diabetes  Have regular diabetes screenings. This checks your fasting blood sugar level. Have the screening done:  Once every three years after age 40 if you are at a normal weight and have a low risk for diabetes.  More often and at a younger age if you are overweight or have a high risk for diabetes.  What should I know about preventing infection?  Hepatitis B  If you have a higher risk for hepatitis B, you should be screened for this virus. Talk with your health care provider to find out if you are at risk for hepatitis B infection.  Hepatitis C  Blood testing is recommended for:  Everyone born from [birth year] through 1967.  Anyone  with known risk factors for hepatitis C.  Sexually transmitted infections (STIs)  You should be screened each year for STIs, including gonorrhea and chlamydia, if:  You are sexually active and are younger than 40 years of age.  You are older than 40 years of age and your health care provider tells you that you are at risk for this type of infection.  Your sexual activity has changed since you were last screened, and you are at increased risk for chlamydia or gonorrhea. Ask your health care provider if you are at risk.  Ask your health care provider about whether you are at high risk for HIV. Your health care provider  may recommend a prescription medicine to help prevent HIV infection. If you choose to take medicine to prevent HIV, you should first get tested for HIV. You should then be tested every 3 months for as long as you are taking the medicine.  Follow these instructions at home:  Alcohol use  Do not drink alcohol if your health care provider tells you not to drink.  If you drink alcohol:  Limit how much you have to 0-2 drinks a day.  Know how much alcohol is in your drink. In the [place] one drink equals one 12 oz bottle of beer (355 mL), one 5 oz glass of wine (148 mL), or one 1 oz glass of hard liquor (44 mL).  Lifestyle  Do not use any products that contain nicotine or tobacco. These products include cigarettes, chewing tobacco, and vaping devices, such as e-cigarettes. If you need help quitting, ask your health care provider.  Do not use street drugs.  Do not share needles.  Ask your health care provider for help if you need support or information about quitting drugs.  General instructions  Schedule regular health, dental, and eye exams.  Stay current with your vaccines.  Tell your health care provider if:  You often feel depressed.  You have ever been abused or do not feel safe at home.  Summary  Adopting a healthy lifestyle and getting preventive care are important in promoting health and wellness.  Follow your health care provider's instructions about healthy diet, exercising, and getting tested or screened for diseases.  Follow your health care provider's instructions on monitoring your cholesterol and blood pressure.  This information is not intended to replace advice given to you by your health care provider. Make sure you discuss any questions you have with your health care provider.  Document Revised: 12/10/2020 Document Reviewed: 12/10/2020  Elsevier Patient Education  2024 [employer].

## 2024-05-20 NOTE — Progress Notes (Signed)
 Office Note 05/20/2024  CC:  Chief Complaint  Patient presents with   Annual Exam    HPI:  Patient is a 38 y.o. male who is here for annual health maintenance exam. Aleksandar feels well.  No acute concerns.   Past Medical History:  Diagnosis Date   Kidney stone 2006    History reviewed. No pertinent surgical history.  History reviewed. No pertinent family history.  Social History   Socioeconomic History   Marital status: Married    Spouse name: Not on file   Number of children: 3   Years of education: Not on file   Highest education level: Not on file  Occupational History   Occupation: Financing    Employer: [employer]  Tobacco Use   Smoking status: Never   Smokeless tobacco: Not on file  Vaping Use   Vaping status: Never Used  Substance and Sexual Activity   Alcohol use: Yes    Alcohol/week: 18.0 standard drinks of alcohol    Types: 18 Standard drinks or equivalent per week   Drug use: Not Currently   Sexual activity: Yes    Partners: Female    Birth control/protection: Condom  Other Topics Concern   Not on file  Social History Narrative   Married, 3 children.  Lives in [place].   Originally from [place] , high school    in [place].  C   College at [employer].   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   Nonsmoker   Social Drivers of Corporate investment banker Strain: Not on file  Food Insecurity: Not on file  Transportation Needs: Not on file  Physical Activity: Not on file  Stress: Not on file  Social Connections: Not on file  Intimate Partner Violence: Not on file    Outpatient Medications Prior to Visit  Medication Sig Dispense Refill   Multiple Vitamins-Minerals (MULTIVITAMIN MEN PO) Take by mouth.     No facility-administered medications prior to visit.    Allergies  Allergen Reactions   Tylenol [Acetaminophen] Itching    Itchy throat.    Review of Systems  Constitutional:  Negative for appetite change, chills, fatigue and fever.  HENT:   Negative for congestion, dental problem, ear pain and sore throat.   Eyes:  Negative for discharge, redness and visual disturbance.  Respiratory:  Negative for cough, chest tightness, shortness of breath and wheezing.   Cardiovascular:  Negative for chest pain, palpitations and leg swelling.  Gastrointestinal:  Negative for abdominal pain, blood in stool, diarrhea, nausea and vomiting.  Genitourinary:  Negative for difficulty urinating, dysuria, flank pain, frequency, hematuria and urgency.  Musculoskeletal:  Negative for arthralgias, back pain, joint swelling, myalgias and neck stiffness.  Skin:  Negative for pallor and rash.  Neurological:  Negative for dizziness, speech difficulty, weakness and headaches.  Hematological:  Negative for adenopathy. Does not bruise/bleed easily.  Psychiatric/Behavioral:  Negative for confusion and sleep disturbance. The patient is not nervous/anxious.     PE;    05/20/2024    3:47 PM 08/18/2022    3:10 PM 04/21/2022   10:05 AM  Vitals with BMI  Height 5' 11.25 5' 11.75 5' 11.75  Weight 197 lbs 193 lbs 10 oz 195 lbs 13 oz  BMI 27.28 26.45 26.75  Systolic 104 121 889  Diastolic 67 87 71  Pulse 69 74 62    Gen: Alert, well appearing.  Patient is oriented to person, place, time, and situation. AFFECT: pleasant, lucid thought and speech. ENT: Ears: EACs  clear, normal epithelium.  TMs with good light reflex and landmarks bilaterally.  Eyes: no injection, icteris, swelling, or exudate.  EOMI, PERRLA. Nose: no drainage or turbinate edema/swelling.  No injection or focal lesion.  Mouth: lips without lesion/swelling.  Oral mucosa pink and moist.  Dentition intact and without obvious caries or gingival swelling.  Oropharynx without erythema, exudate, or swelling.  Neck: supple/nontender.  No LAD, mass, or TM.  Carotid pulses 2+ bilaterally, without bruits. CV: RRR, no m/r/g.   LUNGS: CTA bilat, nonlabored resps, good aeration in all lung fields. ABD: soft,  NT, ND, BS normal.  No hepatospenomegaly or mass.  No bruits. EXT: no clubbing, cyanosis, or edema.  Musculoskeletal: no joint swelling, erythema, warmth, or tenderness.  ROM of all joints intact. Skin - no sores or suspicious lesions or rashes or color changes  Pertinent labs:  Lab Results  Component Value Date   TSH 1.69 04/21/2022   Lab Results  Component Value Date   WBC 4.9 04/21/2022   HGB 14.5 04/21/2022   HCT 42.7 04/21/2022   MCV 89.0 04/21/2022   PLT 258.0 04/21/2022   Lab Results  Component Value Date   CREATININE 1.11 04/21/2022   BUN 15 04/21/2022   NA 141 04/21/2022   K 4.3 04/21/2022   CL 105 04/21/2022   CO2 28 04/21/2022   Lab Results  Component Value Date   ALT 17 04/21/2022   AST 17 04/21/2022   ALKPHOS 60 04/21/2022   BILITOT 0.5 04/21/2022   Lab Results  Component Value Date   CHOL 188 04/21/2022   Lab Results  Component Value Date   HDL 43.60 04/21/2022   Lab Results  Component Value Date   LDLCALC 119 (H) 04/21/2022   Lab Results  Component Value Date   TRIG 131.0 04/21/2022   Lab Results  Component Value Date   CHOLHDL 4 04/21/2022   ASSESSMENT AND PLAN:   Health maintenance exam: Reviewed age and gender appropriate health maintenance issues (prudent diet, regular exercise, health risks of tobacco and excessive alcohol, use of seatbelts, fire alarms in home, use of sunscreen).  Also reviewed age and gender appropriate health screening as well as vaccine recommendations. Vaccines: he declined flu vaccine. Labs: Fasting health panel-> Future when fasting.  An After Visit Summary was printed and given to the patient.  FOLLOW UP:  Return in about 1 year (around 05/20/2025) for annual CPE (fasting).  Signed:  Gerlene Hockey, MD           05/20/2024

## 2024-05-23 ENCOUNTER — Other Ambulatory Visit
